# Patient Record
Sex: Female | Born: 1975 | Hispanic: Yes | Smoking: Never smoker
Health system: Southern US, Community
[De-identification: ages and names within clinical notes are randomized; demographics above are authoritative.]

## PROBLEM LIST (undated history)

## (undated) DIAGNOSIS — R569 Unspecified convulsions: Secondary | ICD-10-CM

## (undated) HISTORY — PX: TUBAL LIGATION: SHX77

## (undated) HISTORY — PX: CHOLECYSTECTOMY: SHX55

---

## 2015-08-14 ENCOUNTER — Encounter (HOSPITAL_BASED_OUTPATIENT_CLINIC_OR_DEPARTMENT_OTHER): Payer: Self-pay | Admitting: *Deleted

## 2015-08-14 ENCOUNTER — Emergency Department (HOSPITAL_BASED_OUTPATIENT_CLINIC_OR_DEPARTMENT_OTHER)
Admission: EM | Admit: 2015-08-14 | Discharge: 2015-08-14 | Disposition: A | Discharge status: Home / Self Care | Payer: Medicaid Other | Attending: Emergency Medicine | Admitting: Emergency Medicine

## 2015-08-14 ENCOUNTER — Emergency Department (HOSPITAL_BASED_OUTPATIENT_CLINIC_OR_DEPARTMENT_OTHER): Payer: Medicaid Other

## 2015-08-14 DIAGNOSIS — W2201XA Walked into wall, initial encounter: Secondary | ICD-10-CM | POA: Insufficient documentation

## 2015-08-14 DIAGNOSIS — S6991XA Unspecified injury of right wrist, hand and finger(s), initial encounter: Secondary | ICD-10-CM | POA: Diagnosis present

## 2015-08-14 DIAGNOSIS — Y929 Unspecified place or not applicable: Secondary | ICD-10-CM | POA: Diagnosis not present

## 2015-08-14 DIAGNOSIS — Z79899 Other long term (current) drug therapy: Secondary | ICD-10-CM | POA: Insufficient documentation

## 2015-08-14 DIAGNOSIS — Y939 Activity, unspecified: Secondary | ICD-10-CM | POA: Insufficient documentation

## 2015-08-14 DIAGNOSIS — Y999 Unspecified external cause status: Secondary | ICD-10-CM | POA: Insufficient documentation

## 2015-08-14 HISTORY — DX: Unspecified convulsions: R56.9

## 2015-08-14 MED ORDER — HYDROCODONE-ACETAMINOPHEN 5-325 MG PO TABS: 1.0000 | ORAL_TABLET | Freq: Four times a day (QID) | ORAL | Status: AC | PRN

## 2015-08-14 NOTE — ED Notes (Signed)
CMS intact before and after. Pt tolerated well. No questions.  

## 2015-08-14 NOTE — ED Notes (Signed)
States she was playing around and got to rough jamming her right 4th digit. Pain in her right hand. Deformity to her finger.

## 2015-08-14 NOTE — ED Provider Notes (Signed)
CSN: [id number]     Arrival date & time 08/14/15  1450 History   First MD Initiated Contact with Patient 08/14/15 1639     Chief Complaint  Patient presents with  . Hand Injury     (Consider location/radiation/quality/duration/timing/severity/associated sxs/prior Treatment) HPI Patient presents to the emergency department with injury to her right fourth and fifth digits.  The patient states that she was messing around and hit the fingers against the wall.  The patient states the pain increased after this.  She states that she had no other injuries.  The patient states that movement and palpation make the pain worse is had no fevers or other injuries to this hand in the past Past Medical History  Diagnosis Date  . Seizures [facility])    Past Surgical History  Procedure Laterality Date  . Cholecystectomy    . Tubal ligation     No family history on file. Social History  Substance Use Topics  . Smoking status: Never Smoker   . Smokeless tobacco: None  . Alcohol Use: Yes     Comment: occ   OB History    No data available     Review of Systems All other systems negative except as documented in the HPI. All pertinent positives and negatives as reviewed in the HPI.   Allergies  Review of patient's allergies indicates no known allergies.  Home Medications   Prior to Admission medications   Medication Sig Start Date End Date Taking? Authorizing Provider  zonisamide (ZONEGRAN) 100 MG capsule Take 500 mg by mouth daily.   Yes Historical Provider, MD   BP 117/84 mmHg  Pulse 81  Temp(Src) 98.4 F (36.9 C) (Oral)  Resp 16  Ht 5\' 1"  (1.549 m)  Wt 74.39 kg  BMI 31.00 kg/m2  SpO2 100%  LMP 07/31/2015 Physical Exam  Constitutional: She is oriented to person, place, and time. She appears well-developed and well-nourished. No distress.  HENT:  Head: Normocephalic and atraumatic.  Eyes: Pupils are equal, round, and reactive to light.  Pulmonary/Chest: Effort normal.    Musculoskeletal:       Hands: Neurological: She is alert and oriented to person, place, and time. She exhibits normal muscle tone. Coordination normal.  Skin: Skin is warm and dry. No rash noted. No erythema.  Psychiatric: She has a normal mood and affect. Her behavior is normal.  Nursing note and vitals reviewed.   ED Course  Procedures (including critical care time) Labs Review Labs Reviewed - No data to display  Imaging Review Dg Hand Complete Right  08/14/2015  CLINICAL DATA:  Pain about the right ring and little fingers. The patient jammed her fingers today. Initial encounter. EXAM: RIGHT HAND - COMPLETE 3+ VIEW COMPARISON:  None. FINDINGS: No acute bony or joint abnormality is identified. Marked joint space narrowing is seen at the DIP joints of the ring and little fingers. There appears to be central erosive change about both DIP joints. Joint spaces are otherwise preserved. Soft tissues are unremarkable. IMPRESSION: No acute abnormality. 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 Electronically Signed   By: Drusilla Kannerhomas  Dalessio M.D.   On: 08/14/2015 15:26   I have personally reviewed and evaluated these images and lab results as part of my medical decision-making.  The patient will be referred to hand surgery.  She appears to have an erosive arthritis the arthritic feel like the injury just exacerbated this issue.  The patient was buddy  taped and told to return here as needed.  Patient is also advised to ice and elevate the fingers    Charlestine Night, PA-C 08/15/15 0202  Arby Barrette, MD 08/16/15 [phone]

## 2015-08-14 NOTE — Discharge Instructions (Signed)
Return here as needed.  Follow-up with the hand specialist for further evaluation and care

## 2015-08-14 NOTE — ED Notes (Signed)
Pt states she was rough housing with a family member when she hit the back of her hand on the wall. Pt c/o severe pain to R ring finger with swelling noted and pain to pinky finger. Pt has not taken anything for pain and states her finger is throbbing.

## 2017-05-18 IMAGING — DX DG HAND COMPLETE 3+V*R*
3 series · 3 of 3 positions shown · non-contrast
Comparison: None.

CLINICAL DATA: Pain about the right ring and little fingers. The
patient jammed her fingers today. Initial encounter.

EXAM:
RIGHT HAND - COMPLETE 3+ VIEW

[hand pa]
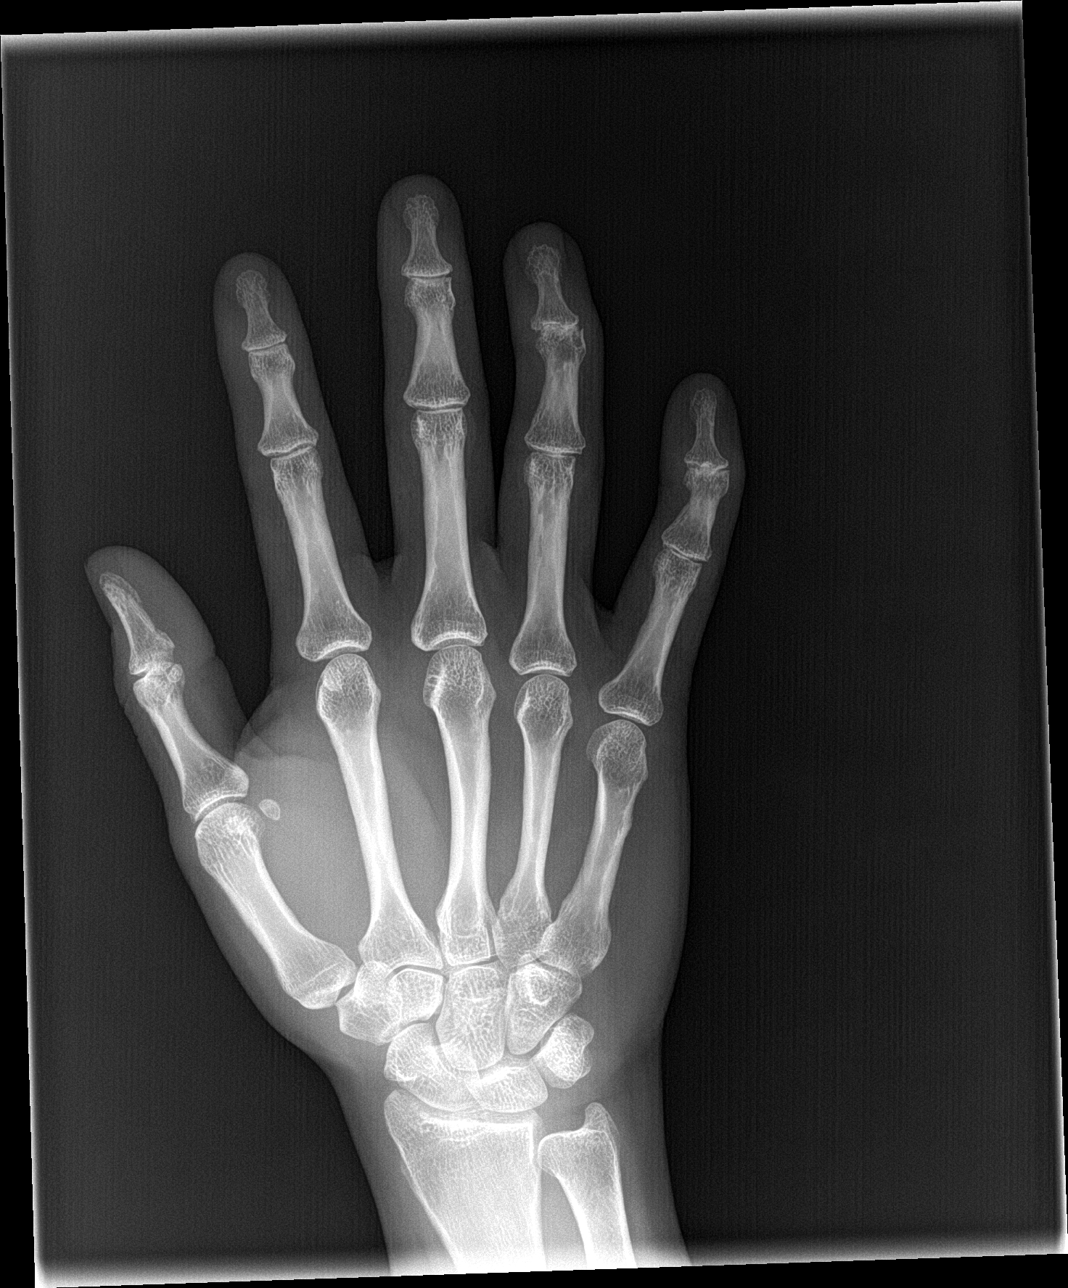

[hand obl]
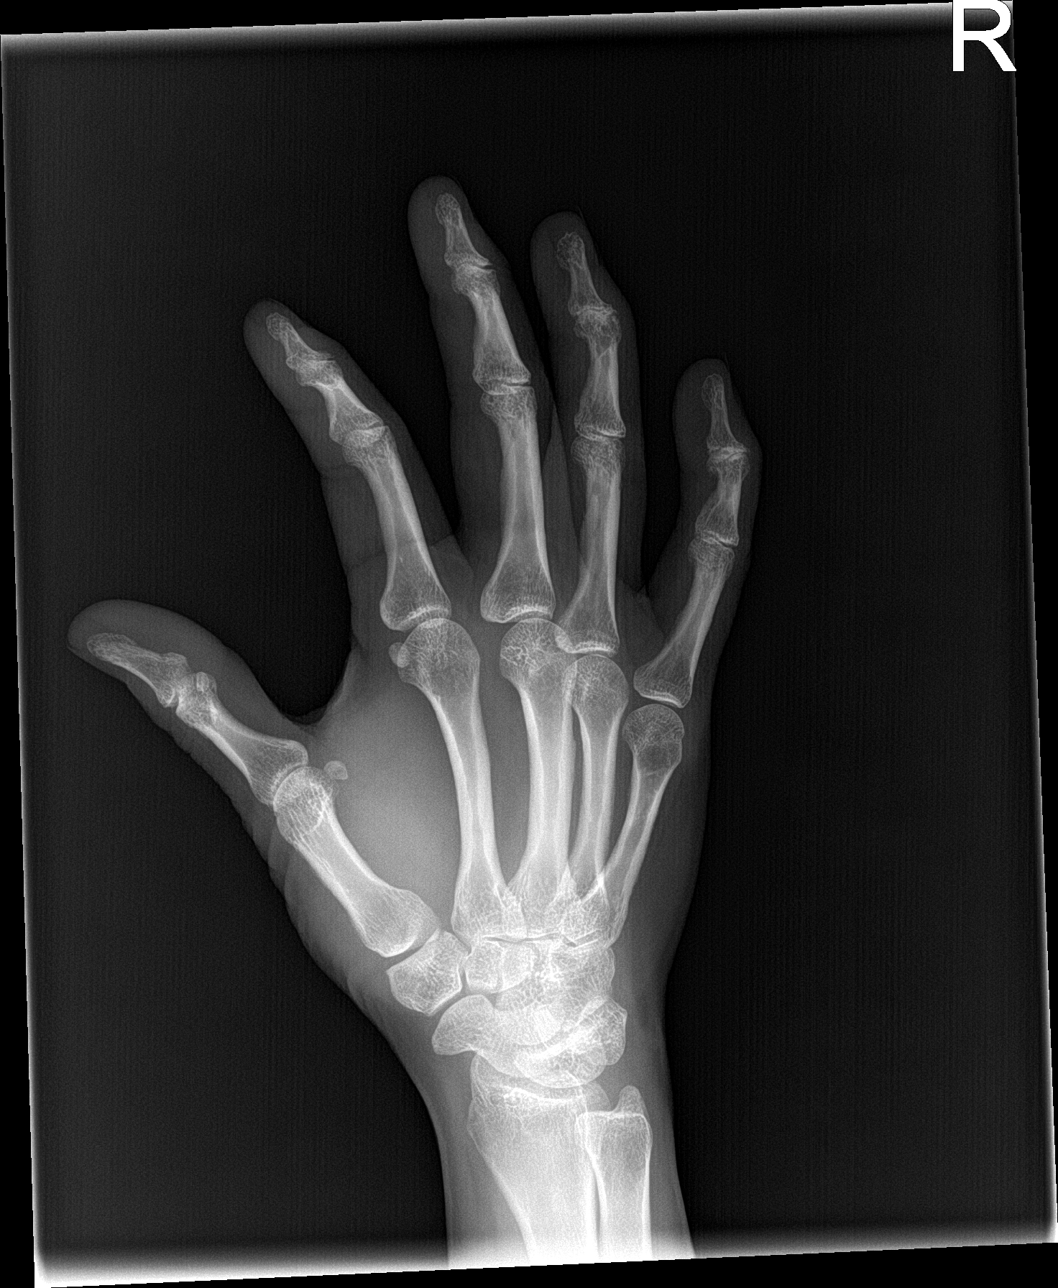

[hand lat]
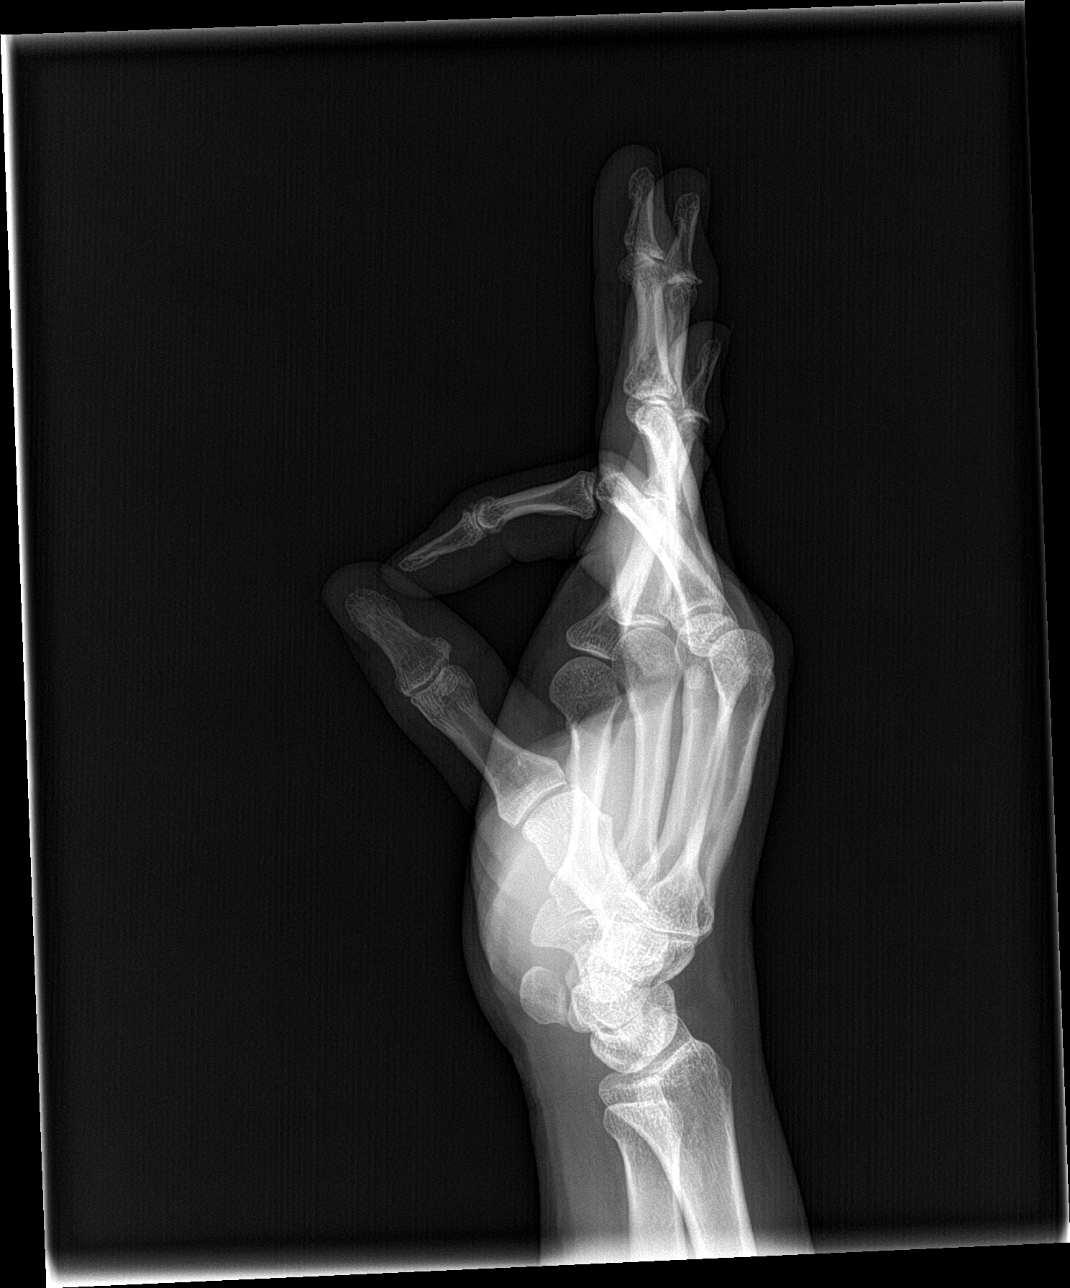

[3 of 3 positions shown; findings below may reference images not displayed]

FINDINGS: No acute bony or joint abnormality is identified. Marked joint space
narrowing is seen at the DIP joints of the ring and little fingers.
There appears to be central erosive change about both DIP joints.
Joint spaces are otherwise preserved. Soft tissues are unremarkable.
IMPRESSION: No acute abnormality.

Findings worrisome for erosive osteoarthritis at the DIP joints of
the ring and little fingers.
